# Patient Record
Sex: Female | Born: 1965 | Race: Asian | Hispanic: No | Marital: Married | Smoking: Never smoker
Health system: Southern US, Community
[De-identification: ages and names within clinical notes are randomized; demographics above are authoritative.]

## PROBLEM LIST (undated history)

## (undated) DIAGNOSIS — M1990 Unspecified osteoarthritis, unspecified site: Secondary | ICD-10-CM

## (undated) HISTORY — DX: Unspecified osteoarthritis, unspecified site: M19.90

---

## 2011-08-12 ENCOUNTER — Encounter: Payer: Self-pay | Admitting: Family Medicine

## 2011-08-12 ENCOUNTER — Ambulatory Visit: Payer: Self-pay | Admitting: Family Medicine

## 2011-08-12 VITALS — BP 112/76 | HR 73 | Temp 98.4°F | Resp 20 | Ht 64.5 in | Wt 127.0 lb

## 2011-08-12 DIAGNOSIS — M1990 Unspecified osteoarthritis, unspecified site: Secondary | ICD-10-CM

## 2011-08-12 DIAGNOSIS — M79609 Pain in unspecified limb: Secondary | ICD-10-CM

## 2011-08-12 DIAGNOSIS — M79643 Pain in unspecified hand: Secondary | ICD-10-CM

## 2011-08-12 DIAGNOSIS — M129 Arthropathy, unspecified: Secondary | ICD-10-CM

## 2011-08-12 LAB — POCT CBC
Granulocyte percent: 66.5 %G (ref 37–80)
HCT, POC: 34.6 % — AB (ref 37.7–47.9)
Hemoglobin: 10.9 g/dL — AB (ref 12.2–16.2)
Lymph, poc: 1.6 (ref 0.6–3.4)
MPV: 7.9 fL (ref 0–99.8)
POC Granulocyte: 3.6 (ref 2–6.9)
RBC: 3.84 M/uL — AB (ref 4.04–5.48)

## 2011-08-12 LAB — POCT SEDIMENTATION RATE: POCT SED RATE: 23 mm/hr — AB (ref 0–22)

## 2011-08-12 LAB — RHEUMATOID FACTOR: Rhuematoid fact SerPl-aCnc: 24 IU/mL — ABNORMAL HIGH (ref <=14)

## 2011-08-12 MED ORDER — DICLOFENAC SODIUM 75 MG PO TBEC: 75.0000 mg | DELAYED_RELEASE_TABLET | Freq: Two times a day (BID) | ORAL | Status: DC

## 2011-08-12 NOTE — Progress Notes (Signed)
Subjective: 46 year old Asian lady with hand pain and numbness. About a month ago she did yard work and experienced pain in her left hand, with some swelling of the fingers at the MCP and PIP joint areas. This gradually subsided. 2 days ago she did a lot of work again, pulling weeds and digging with her hands. Her right hand got worse, with swelling and pain in the second and third finger MCP and PIP joints primarily. There is some pain in the thumbs. She has had some pain in the ball of her feet. The pain has persisted. She works in [employer] as a Conservation officer, nature. She has not had problems like this in the past before these 2 episodes. There is no family history of rheumatologic disease as far as I can tell. She was concerned that the person doing the yard work may have used some chemicals which could have irritated her hands and cause the swelling and discomfort.  Objective: Right hand has a little bit of swelling of the second and third fingers. There is some mild erythema over the joints, MCP and PIP is noted. The risks him okay. She has had some fatigue and generalized not feeling well. The feet grossly looked normal. Radial pulses are good. No definite effusion.  Assessment: Bilateral hand pain, right greater than left Mild foot pain Suspicious for new onset inflammatory arthritis, possibly RA.  I am doubtful that in the yard chemicals are the factor involved.  Plan: CBC Sedimentation rate RA factor  Treatment with NSAID while tests are pending.

## 2011-08-12 NOTE — Patient Instructions (Addendum)
I will let you know the results of your labs in a few days. I am concerned that this could represent an early arthritis disease, such as rheumatoid arthritis.  Take the medicine twice daily with food. This should give her some relief after a couple of days.  If other joints get to hurting her worse come in sooner, otherwise if you're not completely well in about 10 days watch you to return for a recheck.  Below is a handout on rheumatoid arthritis, but that is not yet the diagnosis.  We are waiting for tests.  Rheumatoid Arthritis Rheumatoid arthritis is a long-term (chronic) inflammatory disease that causes pain, swelling, and stiffness of the joints. It can affect the entire body. The effects of rheumatoid arthritis vary widely among those with the condition. CAUSES  The cause of rheumatoid arthritis is not known. It tends to run in families and is more common in women. Certain cells of the body's natural defense system (immune system) do not work properly and begin to attack healthy joints. It primarily involves the connective tissue that lines the joints (synovial membrane). This can cause damage to the joint. SYMPTOMS   Pain, stiffness, swelling, and decreased motion of many joints, especially in the hands and feet.   Stiffness that is worse in the morning. It may last 1 to 2 hours or longer.   Fatigue.   Loss of appetite.   Low-grade fever.   Dry eyes and mouth.   Firm lumps (rheumatoid nodules) that grow beneath the skin in areas such as the elbows and hands.  DIAGNOSIS  Diagnosis is based on the symptoms described, exam, and blood tests. Sometimes, X-rays are helpful. TREATMENT  The goals of treatment are to relieve pain, reduce inflammation, and slow down or stop joint damage and disability. Methods vary and may include:  Maintaining a balance of rest, exercise, and proper nutrition.   Medicines:   Pain relievers (analgesics).   Corticosteroids and nonsteroidal  anti-inflammatory drugs (NSAIDs) to reduce inflammation.   Disease-modifying antirheumatic drugs (DMARDs) to try to slow the course of the disease.   Biologic response modifiers to reduce inflammation and damage.   Surgery for patients with severe joint damage. Joint replacement or fusing of joints may be needed.   Routine monitoring and ongoing care, such as office visits, blood and urine tests, and x-rays.  HOME CARE INSTRUCTIONS   Remain physically active and reduce activity when the disease gets worse.   Eat a well-balanced diet.   Use heat on affected joints when you wake up and before activities.   Use ice on affected joints following activities or exercising.   Take all medicines and supplements as directed by your caregiver.   Use splints as your caregiver prescribes. Splints help maintain joint position and function.   Do not sleep with pillows under your knees. This may lead to spasms.   Participate in a self-management program to keep current with the latest treatment and coping skills.  SEEK IMMEDIATE MEDICAL CARE IF:  You have fainting episodes.   You have periods of extreme weakness.   A hot, painful joint develops rapidly and is more severe than usual joint aches.   You develop chills.   You have a fever.  FOR MORE INFORMATION  American College of Rheumatology: www.rheumatology.org Arthritis Foundation: www.arthritis.org Document Released: 02/11/2000 Document Revised: 02/02/2011 Document Reviewed: 05/27/2009 [facility] Patient Information 2012 [place], [place].

## 2011-08-13 ENCOUNTER — Encounter: Payer: Self-pay | Admitting: Family Medicine

## 2011-09-18 ENCOUNTER — Other Ambulatory Visit: Payer: Self-pay | Admitting: Family Medicine

## 2012-12-21 ENCOUNTER — Ambulatory Visit (INDEPENDENT_AMBULATORY_CARE_PROVIDER_SITE_OTHER): Payer: BC Managed Care – PPO | Admitting: Emergency Medicine

## 2012-12-21 VITALS — BP 92/66 | HR 76 | Temp 100.8°F | Resp 16 | Ht 64.5 in | Wt 115.8 lb

## 2012-12-21 DIAGNOSIS — Z79899 Other long term (current) drug therapy: Secondary | ICD-10-CM

## 2012-12-21 DIAGNOSIS — R319 Hematuria, unspecified: Secondary | ICD-10-CM

## 2012-12-21 DIAGNOSIS — R509 Fever, unspecified: Secondary | ICD-10-CM

## 2012-12-21 LAB — POCT INFLUENZA A/B
Influenza A, POC: NEGATIVE
Influenza B, POC: NEGATIVE

## 2012-12-21 LAB — POCT CBC
Granulocyte percent: 87.3 %G — AB (ref 37–80)
HCT, POC: 38.5 % (ref 37.7–47.9)
Hemoglobin: 12.1 g/dL — AB (ref 12.2–16.2)
Lymph, poc: 0.7 (ref 0.6–3.4)
MCH, POC: 30.4 pg (ref 27–31.2)
MCHC: 31.4 g/dL — AB (ref 31.8–35.4)
MCV: 96.8 fL (ref 80–97)
MID (cbc): 0.2 (ref 0–0.9)
MPV: 8.2 fL (ref 0–99.8)
POC Granulocyte: 6.5 (ref 2–6.9)
POC LYMPH PERCENT: 9.8 %L — AB (ref 10–50)
POC MID %: 2.9 %M (ref 0–12)
Platelet Count, POC: 159 10*3/uL (ref 142–424)
RBC: 3.98 M/uL — AB (ref 4.04–5.48)
RDW, POC: 13.8 %
WBC: 7.4 10*3/uL (ref 4.6–10.2)

## 2012-12-21 LAB — POCT URINALYSIS DIPSTICK
Bilirubin, UA: NEGATIVE
Glucose, UA: NEGATIVE
Ketones, UA: NEGATIVE
Nitrite, UA: POSITIVE
Protein, UA: 30
Spec Grav, UA: 1.02
Urobilinogen, UA: 0.2
pH, UA: 5.5

## 2012-12-21 LAB — COMPREHENSIVE METABOLIC PANEL
ALT: 134 U/L — ABNORMAL HIGH (ref 0–35)
AST: 184 U/L — ABNORMAL HIGH (ref 0–37)
Albumin: 4.7 g/dL (ref 3.5–5.2)
Alkaline Phosphatase: 173 U/L — ABNORMAL HIGH (ref 39–117)
BUN: 10 mg/dL (ref 6–23)
CO2: 25 mEq/L (ref 19–32)
Calcium: 9.4 mg/dL (ref 8.4–10.5)
Chloride: 103 mEq/L (ref 96–112)
Creat: 0.72 mg/dL (ref 0.50–1.10)
Glucose, Bld: 96 mg/dL (ref 70–99)
Potassium: 3.7 mEq/L (ref 3.5–5.3)
Sodium: 138 mEq/L (ref 135–145)
Total Bilirubin: 1.3 mg/dL — ABNORMAL HIGH (ref 0.3–1.2)
Total Protein: 7.7 g/dL (ref 6.0–8.3)

## 2012-12-21 LAB — POCT UA - MICROSCOPIC ONLY
Casts, Ur, LPF, POC: NEGATIVE
Mucus, UA: NEGATIVE
Yeast, UA: NEGATIVE

## 2012-12-21 MED ORDER — CIPROFLOXACIN HCL 250 MG PO TABS: 250.0000 mg | ORAL_TABLET | Freq: Two times a day (BID) | ORAL | Status: DC

## 2012-12-21 NOTE — Progress Notes (Signed)
Subjective:    Patient ID: Nancy Hood, female    DOB: 22-Apr-1965, 47 y.o.   MRN: [id number]  Fever  Pertinent negatives include no abdominal pain, congestion, coughing, ear pain, headaches, nausea, rash, sore throat or vomiting.   47 year old female presents for evaluation of fever. Symptoms started on 12/19/12 and have persisted.  Admits to fever alone. Denies cough, nasal congestion, sore throat, abdominal pain, nausea, vomiting, headache, otalgia, sinus pain, dysuria, or urinary frequency.  Has no hx of recent travel.  No known sick contacts. Does work at [employer].   Hx of RA treated with methotrexate. Last saw her Rheumatologist in 06/2012 - doing well. Tolerating medication. Joint pain significantly better .    Review of Systems  Constitutional: Positive for fever and chills.  HENT: Negative for congestion, ear pain, sinus pressure and sore throat.   Respiratory: Negative for cough and shortness of breath.   Gastrointestinal: Negative for nausea, vomiting and abdominal pain.  Skin: Negative for rash.  Neurological: Positive for dizziness. Negative for headaches.       Objective:   Physical Exam  Constitutional: She is oriented to person, place, and time. She appears well-developed and well-nourished.  HENT:  Head: Normocephalic and atraumatic.  Right Ear: Hearing, tympanic membrane, external ear and ear canal normal.  Left Ear: Hearing, tympanic membrane, external ear and ear canal normal.  Mouth/Throat: Uvula is midline, oropharynx is clear and moist and mucous membranes are normal.  Eyes: Conjunctivae are normal.  Neck: Normal range of motion. Neck supple.  Cardiovascular: Normal rate, regular rhythm and normal heart sounds.   Pulmonary/Chest: Effort normal and breath sounds normal.  Abdominal: Soft. Bowel sounds are normal. There is no tenderness. There is no rebound, no guarding and no CVA tenderness.  Lymphadenopathy:    She has no cervical adenopathy.  Neurological: She  is alert and oriented to person, place, and time.  Psychiatric: She has a normal mood and affect. Her behavior is normal. Judgment and thought content normal.     Results for orders placed in visit on 12/21/12  POCT CBC      Result Value Range   WBC 7.4  4.6 - 10.2 K/uL   Lymph, poc 0.7  0.6 - 3.4   POC LYMPH PERCENT 9.8 (*) 10 - 50 %L   MID (cbc) 0.2  0 - 0.9   POC MID % 2.9  0 - 12 %M   POC Granulocyte 6.5  2 - 6.9   Granulocyte percent 87.3 (*) 37 - 80 %G   RBC 3.98 (*) 4.04 - 5.48 M/uL   Hemoglobin 12.1 (*) 12.2 - 16.2 g/dL   HCT, POC 62.1  30.8 - 47.9 %   MCV 96.8  80 - 97 fL   MCH, POC 30.4  27 - 31.2 pg   MCHC 31.4 (*) 31.8 - 35.4 g/dL   RDW, POC 65.7     Platelet Count, POC 159  142 - 424 K/uL   MPV 8.2  0 - 99.8 fL  POCT INFLUENZA A/B      Result Value Range   Influenza A, POC Negative     Influenza B, POC Negative    POCT URINALYSIS DIPSTICK      Result Value Range   Color, UA yellow     Clarity, UA clear     Glucose, UA neg     Bilirubin, UA neg     Ketones, UA neg     Spec Grav, UA  1.020     Blood, UA Trace     pH, UA 5.5     Protein, UA 30     Urobilinogen, UA 0.2     Nitrite, UA Pos     Leukocytes, UA Trace    POCT UA - MICROSCOPIC ONLY      Result Value Range   WBC, Ur, HPF, POC 8-10     RBC, urine, microscopic 6-12     Bacteria, U Microscopic 3+     Mucus, UA Neg     Epithelial cells, urine per micros 0-2     Crystals, Ur, HPF, POC Calcium Oxilate     Casts, Ur, LPF, POC neg     Yeast, UA neg        Ibuprofen 600 mg given today in the office.     Assessment & Plan:  Fever, unspecified - Plan: POCT CBC, POCT Influenza A/B, POCT urinalysis dipstick, POCT UA - Microscopic Only  Encounter for long-term (current) use of high-risk medication - Plan: Comprehensive metabolic panel  UA suspicious for urinary tract infection Will treat with Cipro 250 mg bid x 5 days.  Increase fluids and rest Urine culture sent Continue ibuprofen or motrin as  needed for fever. RTC for recheck if fever has not resolved in 48 hours, sooner if worse

## 2012-12-21 NOTE — Patient Instructions (Signed)
Fever   Fever is a higher-than-normal body temperature. A normal temperature varies with:   Age.   How it is measured (mouth, underarm, rectal, or ear).   Time of day.  In an adult, an oral temperature around 98.6 Fahrenheit (F) or 37 Celsius (C) is considered normal. A rise in temperature of about 1.8 F or 1 C is generally considered a fever (100.4 F or 38 C). In an infant age 45 days or less, a rectal temperature of 100.4 F (38 C) generally is regarded as fever. Fever is not a disease but can be a symptom of illness.  CAUSES    Fever is most commonly caused by infection.   Some non-infectious problems can cause fever. For example:   Some arthritis problems.   Problems with the thyroid or adrenal glands.   Immune system problems.   Some kinds of cancer.   A reaction to certain medicines.   Occasionally, the source of a fever cannot be determined. This is sometimes called a "Fever of Unknown Origin" (FUO).   Some situations may lead to a temporary rise in body temperature that may go away on its own. Examples are:   Childbirth.   Surgery.   Some situations may cause a rise in body temperature but these are not considered "true fever". Examples are:   Intense exercise.   Dehydration.   Exposure to high outside or room temperatures.  SYMPTOMS    Feeling warm or hot.   Fatigue or feeling exhausted.   Aching all over.   Chills.   Shivering.   Sweats.  DIAGNOSIS   A fever can be suspected by your caregiver feeling that your skin is unusually warm. The fever is confirmed by taking a temperature with a thermometer. Temperatures can be taken different ways. Some methods are accurate and some are not:  With adults, adolescents, and children:    An oral temperature is used most commonly.   An ear thermometer will only be accurate if it is positioned as recommended by the manufacturer.   Under the arm temperatures are not accurate and not recommended.   Most electronic thermometers are fast  and accurate.  Infants and Toddlers:   Rectal temperatures are recommended and most accurate.   Ear temperatures are not accurate in this age group and are not recommended.   Skin thermometers are not accurate.  RISKS AND COMPLICATIONS    During a fever, the body uses more oxygen, so a person with a fever may develop rapid breathing or shortness of breath. This can be dangerous especially in people with heart or lung disease.   The sweats that occur following a fever can cause dehydration.   High fever can cause seizures in infants and children.   Older persons can develop confusion during a fever.  TREATMENT    Medications may be used to control temperature.   Do not give aspirin to children with fevers. There is an association with Reye's syndrome. Reye's syndrome is a rare but potentially deadly disease.   If an infection is present and medications have been prescribed, take them as directed. Finish the full course of medications until they are gone.   Sponging or bathing with room-temperature water may help reduce body temperature. Do not use ice water or alcohol sponge baths.   Do not over-bundle children in blankets or heavy clothes.   Drinking adequate fluids during an illness with fever is important to prevent dehydration.  HOME CARE INSTRUCTIONS      For adults, rest and adequate fluid intake are important. Dress according to how you feel, but do not over-bundle.   Drink enough water and/or fluids to keep your urine clear or pale yellow.   For infants over 45 years and children, giving medication as directed by your caregiver to control fever can help with comfort. The amount to be given is based on the child's weight. Do NOT give more than is recommended.  SEEK MEDICAL CARE IF:    You or your child are unable to keep fluids down.   Vomiting or diarrhea develops.   You develop a skin rash.   An oral temperature above 102 F (38.9 C) develops, or a fever which persists for over 3  days.   You develop excessive weakness, dizziness, fainting or extreme thirst.   Fevers keep coming back after 3 days.  SEEK IMMEDIATE MEDICAL CARE IF:    Shortness of breath or trouble breathing develops   You pass out.   You feel you are making little or no urine.   New pain develops that was not there before (such as in the head, neck, chest, back, or abdomen).   You cannot hold down fluids.   Vomiting and diarrhea persist for more than a day or two.   You develop a stiff neck and/or your eyes become sensitive to light.   An unexplained temperature above 102 F (38.9 C) develops.  Document Released: 02/13/2005 Document Revised: 05/08/2011 Document Reviewed: 01/30/2008  [employer] Patient Information 2014 [employer].

## 2012-12-22 ENCOUNTER — Telehealth: Payer: Self-pay | Admitting: Radiology

## 2012-12-22 MED ORDER — NITROFURANTOIN MONOHYD MACRO 100 MG PO CAPS: 100.0000 mg | ORAL_CAPSULE | Freq: Two times a day (BID) | ORAL | Status: DC

## 2012-12-22 NOTE — Telephone Encounter (Signed)
Rx for macrobid sent to pharmacy

## 2012-12-22 NOTE — Telephone Encounter (Signed)
Called patient, her LFT's elevated, she will need an alternative antibiotic. She also needs a repeat CMET today. She does still have a fever. She will d/c the Cipro, wants another antibiotic sent in. Patient agrees to come back for the lab only today

## 2012-12-23 ENCOUNTER — Telehealth: Payer: Self-pay | Admitting: Physician Assistant

## 2012-12-23 NOTE — Telephone Encounter (Signed)
Called,  again. Patient states she feels fine and her fever is gone. She states she has appt today with her rheumatologist, at 10 am. He is in [place], his name is Dr Cardell Peach. His number is [phone]. I called the office there, and spoke to the nurse. Marylene Land has asked me to fax information to her [phone]. The information has been faxed and nurse Marylene Land is aware patient has elevated LFT's and UTI. Advised further Cipro initially used, but changed to Macrobid due to liver functions.  To you FYI

## 2012-12-23 NOTE — Telephone Encounter (Signed)
Please call patient and tell her that she does still need to come in to repeat her CMET. She should also hold her Methotrexate and contact her Rheumatologist. Please find out who this is so we can call them and also fax her notes/labs

## 2012-12-23 NOTE — Telephone Encounter (Signed)
noted 

## 2012-12-24 LAB — URINE CULTURE: Colony Count: >=100000

## 2016-03-30 ENCOUNTER — Encounter (HOSPITAL_BASED_OUTPATIENT_CLINIC_OR_DEPARTMENT_OTHER): Payer: Self-pay

## 2016-03-30 ENCOUNTER — Emergency Department (HOSPITAL_BASED_OUTPATIENT_CLINIC_OR_DEPARTMENT_OTHER): Payer: BLUE CROSS/BLUE SHIELD

## 2016-03-30 ENCOUNTER — Emergency Department (HOSPITAL_BASED_OUTPATIENT_CLINIC_OR_DEPARTMENT_OTHER)
Admission: EM | Admit: 2016-03-30 | Discharge: 2016-03-31 | Disposition: A | Discharge status: Home / Self Care | Payer: BLUE CROSS/BLUE SHIELD | Attending: Emergency Medicine | Admitting: Emergency Medicine

## 2016-03-30 DIAGNOSIS — Y9389 Activity, other specified: Secondary | ICD-10-CM | POA: Insufficient documentation

## 2016-03-30 DIAGNOSIS — Y990 Civilian activity done for income or pay: Secondary | ICD-10-CM | POA: Insufficient documentation

## 2016-03-30 DIAGNOSIS — W010XXA Fall on same level from slipping, tripping and stumbling without subsequent striking against object, initial encounter: Secondary | ICD-10-CM | POA: Diagnosis not present

## 2016-03-30 DIAGNOSIS — S6992XA Unspecified injury of left wrist, hand and finger(s), initial encounter: Secondary | ICD-10-CM | POA: Diagnosis present

## 2016-03-30 DIAGNOSIS — S52202A Unspecified fracture of shaft of left ulna, initial encounter for closed fracture: Secondary | ICD-10-CM | POA: Diagnosis not present

## 2016-03-30 DIAGNOSIS — Y929 Unspecified place or not applicable: Secondary | ICD-10-CM | POA: Diagnosis not present

## 2016-03-30 DIAGNOSIS — S52615A Nondisplaced fracture of left ulna styloid process, initial encounter for closed fracture: Secondary | ICD-10-CM

## 2016-03-30 DIAGNOSIS — S52572A Other intraarticular fracture of lower end of left radius, initial encounter for closed fracture: Secondary | ICD-10-CM | POA: Diagnosis not present

## 2016-03-30 DIAGNOSIS — S52502A Unspecified fracture of the lower end of left radius, initial encounter for closed fracture: Secondary | ICD-10-CM

## 2016-03-30 MED ORDER — OXYCODONE-ACETAMINOPHEN 5-325 MG PO TABS
1.0000 | ORAL_TABLET | ORAL | Status: DC | PRN
  Administered 2016-03-30: 1 via ORAL
  Filled 2016-03-30: qty 1

## 2016-03-30 NOTE — ED Triage Notes (Addendum)
Slipped/fell approx 30 min PTA-left wrist injury-swelling noted-grimacing-steady gait

## 2016-03-30 NOTE — ED Notes (Signed)
ED Provider at bedside. 

## 2016-03-30 NOTE — ED Notes (Signed)
Pt refuses to keep ice pack on wrist.  Sts it makes it hurt worse.

## 2016-03-30 NOTE — ED Notes (Signed)
Patient transported to X-ray 

## 2016-03-30 NOTE — ED Notes (Signed)
Pt was given ice pack but states too painful to tolerate

## 2016-03-30 NOTE — ED Provider Notes (Signed)
[facility] Provider Note   CSN: [id number] Arrival date & time: 03/30/16  2154  By signing my name below, I, Marnette Burgess Long, attest that this documentation has been prepared under the direction and in the presence of Homer Pfeifer M. Suzzane Quilter, PA-C. Electronically Signed: Marnette Burgess Long, Scribe. 03/30/2016. 11:54 PM.  History   Chief Complaint Chief Complaint  Patient presents with  . Wrist Injury    The history is provided by the patient and the spouse. No language interpreter was used.    HPI Comments:  Nancy Hood is a 51 y.o. female with a PMHx of arthritis, who presents to the Emergency Department complaining of gradually worsening left wrist pain and swelling s/p a fall thirty minutes PTA this evening. She slipped on a puddle of water at her work and fell at ground level, using her left wrist to catch her fall. She denies LOC or head injury. Pt reports hearing a "snap" at impact. She additionally reports she is right handed. She has not taken anything for relief of her symptoms and no alleviating factors noted. She reports palpation, pressure, and movement exacerbates her pain. Her pain does not radiate past the forearm. She denies numbness, chest pain, abdominal pain, and any other injuries at this time.   Past Medical History:  Diagnosis Date  . Arthritis     There are no active problems to display for this patient.   Past Surgical History:  Procedure Laterality Date  . CESAREAN SECTION      OB History    No data available       Home Medications    Prior to Admission medications   Medication Sig Start Date End Date Taking? Authorizing Provider  folic acid (FOLVITE) 1 MG tablet Take 1 mg by mouth daily.    Historical Provider, MD  ibuprofen (ADVIL,MOTRIN) 800 MG tablet Take 1 tablet (800 mg total) by mouth 3 (three) times daily. 03/31/16   Emi Holes, PA-C  methotrexate (RHEUMATREX) 2.5 MG tablet Take 7.5 mg by mouth once a week. Patient is to take six  tablets by mouth once weekly    Historical Provider, MD  oxyCODONE-acetaminophen (PERCOCET/ROXICET) 5-325 MG tablet Take 1-2 tablets by mouth every 6 (six) hours as needed for severe pain. 03/31/16   Emi Holes, PA-C    Family History No family history on file.  Social History Social History  Substance Use Topics  . Smoking status: Never Smoker  . Smokeless tobacco: Never Used  . Alcohol use No     Allergies   Patient has no known allergies.   Review of Systems Review of Systems  Constitutional: Negative for chills and fever.  HENT: Negative for facial swelling and sore throat.   Respiratory: Negative for shortness of breath.   Cardiovascular: Negative for chest pain.  Gastrointestinal: Negative for abdominal pain, nausea and vomiting.  Genitourinary: Negative for dysuria.  Musculoskeletal: Positive for arthralgias, joint swelling (left wrist) and myalgias. Negative for back pain.  Skin: Negative for rash and wound.  Neurological: Negative for syncope, numbness and headaches.  Psychiatric/Behavioral: The patient is not nervous/anxious.      Physical Exam Updated Vital Signs BP 118/79 (BP Location: Right Arm)   Pulse 72   Temp 98 F (36.7 C) (Oral)   Resp 18   SpO2 100%   Physical Exam  Constitutional: She appears well-developed and well-nourished. No distress.  HENT:  Head: Normocephalic and atraumatic.  Mouth/Throat: Oropharynx is clear and moist. No oropharyngeal  exudate.  Eyes: Conjunctivae are normal. Pupils are equal, round, and reactive to light. Right eye exhibits no discharge. Left eye exhibits no discharge. No scleral icterus.  Neck: Normal range of motion. Neck supple. No thyromegaly present.  Cardiovascular: Normal rate, regular rhythm, normal heart sounds and intact distal pulses.  Exam reveals no gallop and no friction rub.   No murmur heard. Pulmonary/Chest: Effort normal and breath sounds normal. No stridor. No respiratory distress. She has no  wheezes. She has no rales.  Abdominal: Soft. Bowel sounds are normal. She exhibits no distension. There is no tenderness. There is no rebound and no guarding.  Musculoskeletal: She exhibits no edema.  Left wrist: Tenderness and significant edema. Tenderness to distal ulna and radius.  Pt can flex and extend all DIPs and PIPs.  Pain limiting abduction but intact.  Adduction intact.  All digits normal sensation, capillary refill <2 seconds.   Bilateral Radial pulses 2+.   Lymphadenopathy:    She has no cervical adenopathy.  Neurological: She is alert. Coordination normal.  Skin: Skin is warm and dry. No rash noted. She is not diaphoretic. No pallor.  Psychiatric: She has a normal mood and affect.  Nursing note and vitals reviewed.    ED Treatments / Results  DIAGNOSTIC STUDIES:  Oxygen Saturation is 100% on RA, normal by my interpretation.    COORDINATION OF CARE:  11:42 PM Discussed treatment plan with pt at bedside including consult to orthopedic hand surgeon Dr. Janee Mornhompson and pt agreed to plan.  Labs (all labs ordered are listed, but only abnormal results are displayed) Labs Reviewed - No data to display  EKG  EKG Interpretation None       Radiology Dg Wrist Complete Left  Result Date: 03/30/2016 CLINICAL DATA:  51 y/o  F; status post fall with wrist pain. EXAM: LEFT WRIST - COMPLETE 3+ VIEW COMPARISON:  None. FINDINGS: Impacted intra-articular minimally displaced and volar apex angulated acute distal radial fracture. Nondisplaced ulnar styloid process fracture. No other fracture or dislocation identified. IMPRESSION: Impacted intra-articular minimally displaced and volar apex angulated acute distal radial fracture. Nondisplaced ulnar styloid process acute fracture. Electronically Signed   By: Mitzi HansenLance  Furusawa-Stratton M.D.   On: 03/30/2016 22:29    Procedures Procedures   Medications Ordered in ED Medications  oxyCODONE-acetaminophen (PERCOCET/ROXICET) 5-325 MG per  tablet 1 tablet (1 tablet Oral Given 03/30/16 2244)  oxyCODONE-acetaminophen (PERCOCET/ROXICET) 5-325 MG per tablet 1 tablet (1 tablet Oral Given 03/31/16 0032)     Initial Impression / Assessment and Plan / ED Course  I have reviewed the triage vital signs and the nursing notes.  Pertinent labs & imaging results that were available during my care of the patient were reviewed by me and considered in my medical decision making (see chart for details).     X-ray of the left wrist shows [Impacted intra-articular minimally displaced and volar apex angulated acute distal radial fracture. Nondisplaced ulnar styloid process acute fracture.] Patient neurovascularly intact. Patient's pain controlled in ED with Percocet. I consulted hand surgery and spoke with Dr. Janee Mornhompson who advised to place patient in sugar tong splint. Dr. Carollee Massedhompson's office will call patient tomorrow to arrange for follow-up next week. Patient discharged home with Percocet and ibuprofen for pain control. I reviewed [place] narcotic database and found no discrepancies. Return precautions discussed. Patient understands and agrees the plan. Patient vitals stable throughout ED course and discharged in satisfactory condition.  Final Clinical Impressions(s) / ED Diagnoses   Final diagnoses:  Closed  fracture of distal end of left radius, unspecified fracture morphology, initial encounter  Closed nondisplaced fracture of styloid process of left ulna, initial encounter    New Prescriptions New Prescriptions   IBUPROFEN (ADVIL,MOTRIN) 800 MG TABLET    Take 1 tablet (800 mg total) by mouth 3 (three) times daily.   OXYCODONE-ACETAMINOPHEN (PERCOCET/ROXICET) 5-325 MG TABLET    Take 1-2 tablets by mouth every 6 (six) hours as needed for severe pain.   I personally performed the services described in this documentation, which was scribed in my presence. The recorded information has been reviewed and is accurate.     Emi Holes, PA-C 03/31/16  0047    Paula Libra, MD 03/31/16 [phone]

## 2016-03-31 MED ORDER — OXYCODONE-ACETAMINOPHEN 5-325 MG PO TABS: 1.0000 | ORAL_TABLET | Freq: Four times a day (QID) | ORAL | 0 refills | Status: AC | PRN

## 2016-03-31 MED ORDER — IBUPROFEN 800 MG PO TABS: 800.0000 mg | ORAL_TABLET | Freq: Three times a day (TID) | ORAL | 0 refills | Status: AC

## 2016-03-31 MED ORDER — OXYCODONE-ACETAMINOPHEN 5-325 MG PO TABS
1.0000 | ORAL_TABLET | Freq: Once | ORAL | Status: AC
Start: 2016-03-31 — End: 2016-03-31
  Administered 2016-03-31: 1 via ORAL
  Filled 2016-03-31: qty 1

## 2016-03-31 NOTE — ED Notes (Signed)
Will return to do splint after Pt gets in a paper scrubs top, Pt wants to keep top and does not want it cut.

## 2016-03-31 NOTE — Discharge Instructions (Signed)
Medications: Percocet, ibuprofen  Treatment: Take 1-2 Percocet every 4-6 hours as needed for severe pain. Take ibuprofen every 8 hours as needed for mild to moderate pain. Keep arm elevated and propped up on pillows to help with swelling. Keep splint applied, clean, and dry until your seen by Dr. Janee Mornhompson.  Follow-up: Dr. Carollee Massedhompson's office will call you tomorrow to schedule appointment next week. Please call his office if you do not hear from his office within the today. Please return to the emergency department if you develop any new or worsening symptoms.

## 2018-01-05 IMAGING — DX DG WRIST COMPLETE 3+V*L*
4 series · 4 of 4 positions shown · non-contrast
Comparison: None.

CLINICAL DATA: 50 y/o  F; status post fall with wrist pain.

EXAM:
LEFT WRIST - COMPLETE 3+ VIEW

[wrist pa]
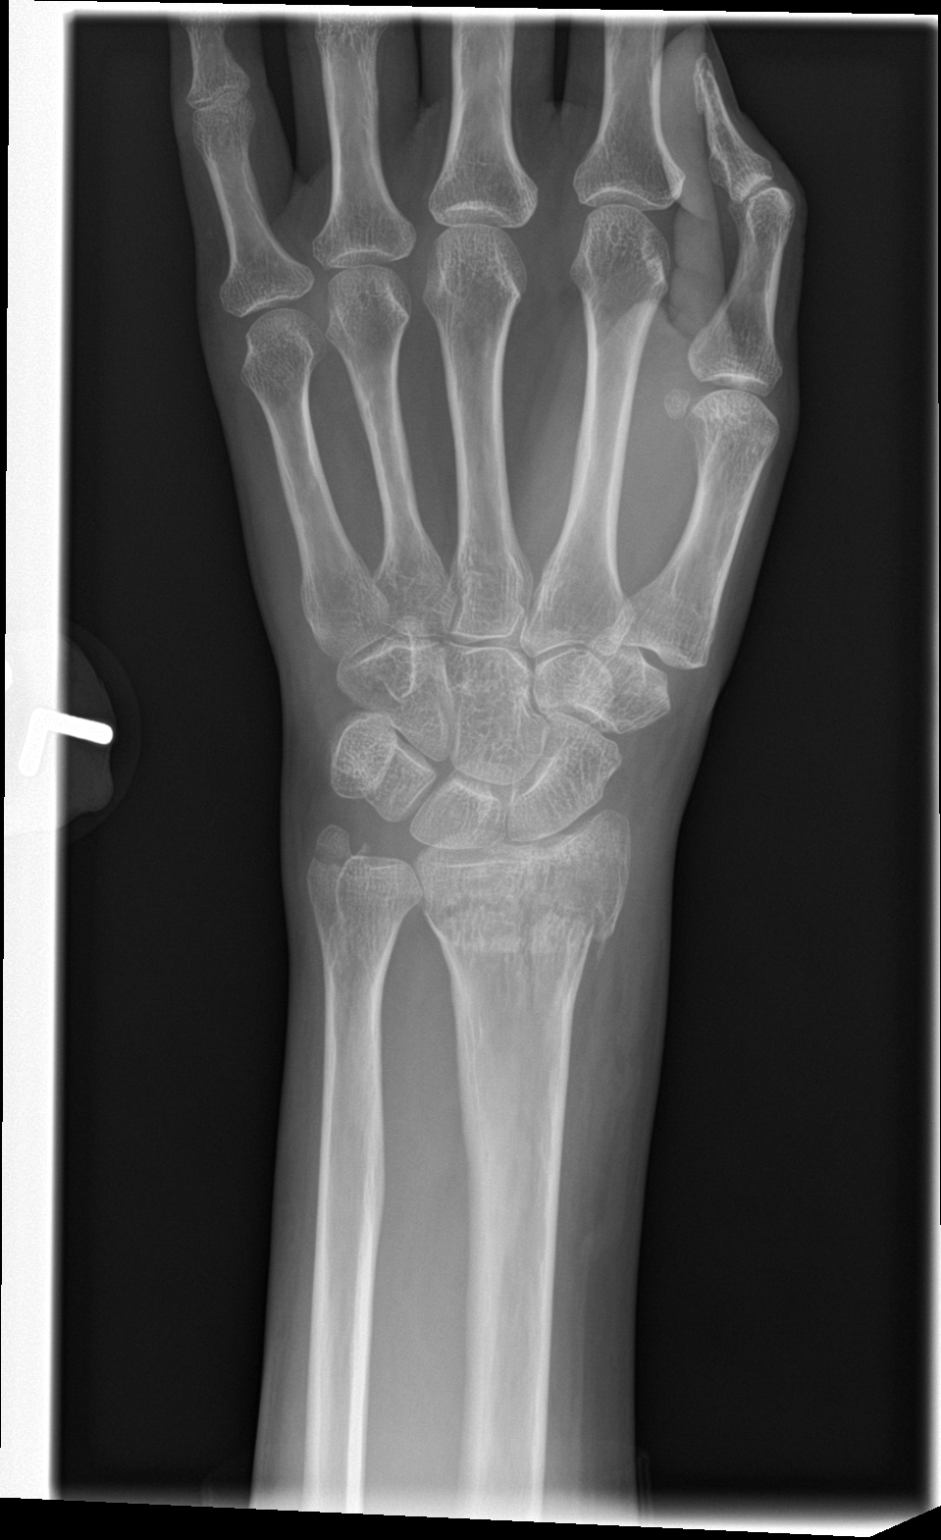

[wrist obl]
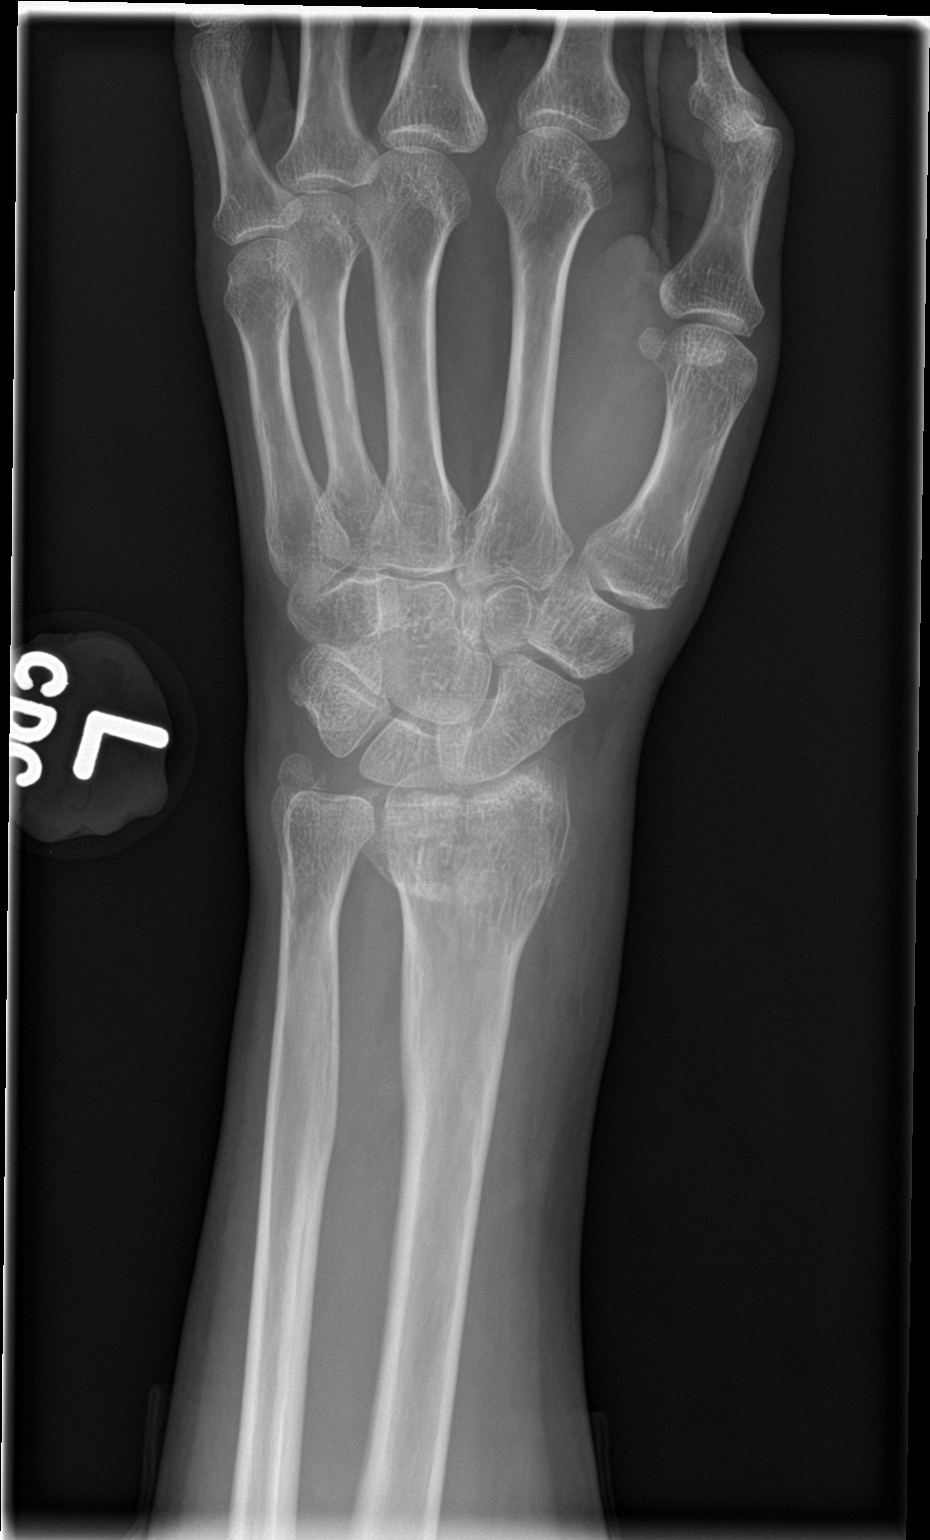

[wrist lat]
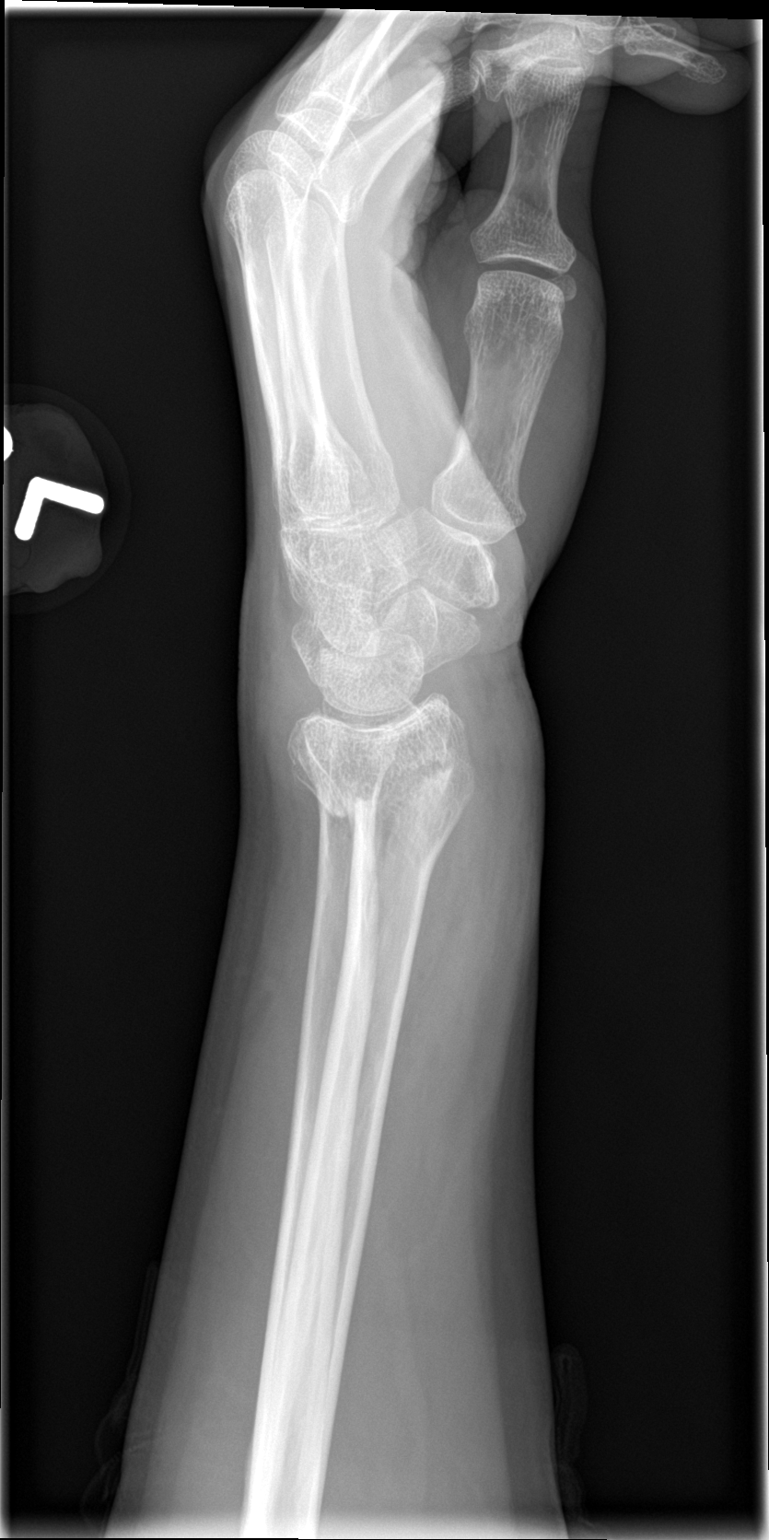

[wrist navicular]
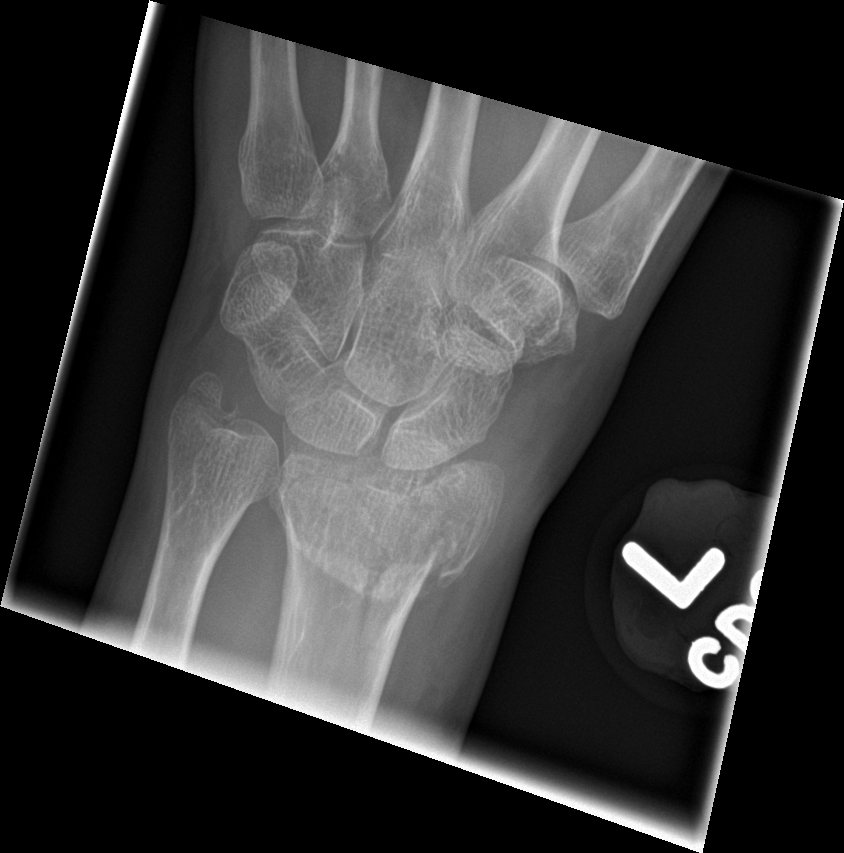

[4 of 4 positions shown; findings below may reference images not displayed]

FINDINGS: Impacted intra-articular minimally displaced and volar apex
angulated acute distal radial fracture. Nondisplaced ulnar styloid
process fracture. No other fracture or dislocation identified.
IMPRESSION: Impacted intra-articular minimally displaced and volar apex
angulated acute distal radial fracture. Nondisplaced ulnar styloid
process acute fracture.

By: Rahimallah Malat M.D.
